# Patient Record
Sex: Female | Born: 2000 | Race: Black or African American | Hispanic: No | Marital: Single | Smoking: Never smoker
Health system: Southern US, Community
[De-identification: ages and names within clinical notes are randomized; demographics above are authoritative.]

## PROBLEM LIST (undated history)

## (undated) DIAGNOSIS — E669 Obesity, unspecified: Secondary | ICD-10-CM

## (undated) DIAGNOSIS — J189 Pneumonia, unspecified organism: Secondary | ICD-10-CM

## (undated) DIAGNOSIS — T7840XA Allergy, unspecified, initial encounter: Secondary | ICD-10-CM

## (undated) DIAGNOSIS — J0100 Acute maxillary sinusitis, unspecified: Secondary | ICD-10-CM

## (undated) HISTORY — PX: OTHER SURGICAL HISTORY: SHX169

## (undated) HISTORY — DX: Obesity, unspecified: E66.9

## (undated) HISTORY — DX: Allergy, unspecified, initial encounter: T78.40XA

## (undated) HISTORY — DX: Acute maxillary sinusitis, unspecified: J01.00

## (undated) HISTORY — DX: Pneumonia, unspecified organism: J18.9

---
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
## 2022-02-24 ENCOUNTER — Ambulatory Visit (INDEPENDENT_AMBULATORY_CARE_PROVIDER_SITE_OTHER): Payer: BC Managed Care – PPO | Admitting: Pediatrics

## 2022-02-24 VITALS — BP 102/80 | Wt 276.2 lb

## 2022-02-24 DIAGNOSIS — F32A Depression, unspecified: Secondary | ICD-10-CM

## 2022-02-24 DIAGNOSIS — Z9189 Other specified personal risk factors, not elsewhere classified: Secondary | ICD-10-CM | POA: Diagnosis not present

## 2022-02-24 DIAGNOSIS — R45851 Suicidal ideations: Secondary | ICD-10-CM | POA: Diagnosis not present

## 2022-02-24 DIAGNOSIS — F419 Anxiety disorder, unspecified: Secondary | ICD-10-CM

## 2022-02-24 LAB — POCT URINALYSIS DIPSTICK
Bilirubin, UA: NEGATIVE
Blood, UA: NEGATIVE
Glucose, UA: NEGATIVE
Ketones, UA: NEGATIVE
Leukocytes, UA: NEGATIVE
Nitrite, UA: NEGATIVE
Protein, UA: NEGATIVE
Spec Grav, UA: 1.02 (ref 1.010–1.025)
Urobilinogen, UA: NEGATIVE E.U./dL — AB
pH, UA: 6 (ref 5.0–8.0)

## 2022-02-24 LAB — POCT URINE PREGNANCY: Preg Test, Ur: NEGATIVE

## 2022-02-24 NOTE — Progress Notes (Unsigned)
Subjective:  Mariah Thomas is a 21 year old young adult her with the following complaints:  1)Feeling nausea 2) having cramps (period like cramps but not on period) 3) lightheaded, photophobia 4) Headaches-along the side of the head 5) internal and external stressors  -taking 9 classes this semester  -works, volunteers, and has an Associate Professor  -knows her boyfriend is cheating on her   -"I really have no business being with him. He's 25 and has 5 kids".    -doesn't see boyfriend often because he works 3 jobs "because he already has 5 kids"   -doesn't want to break up with boyfriend because she doesn't want to be alone 6) self-medicates  -smokes pot 3 to 4 times per day, "when I have the money"  -drinks alcohol- "more alcohol than water", drank 1/2 a bottle of rum last night 7) is sexually active with her boyfriend  -wants to get back on birthcontrol   -uses condoms- "I do not want kids, ever" 8) reports SI but "I don't have time to do anything about it"  -has seen therapist on campus- "he told me I was a hypocrite"   -when asked for clarification- "I take care of everyone else but don't take care of myself"  -reports that she's safe to herself at this time  -has the number for the mental health crisis hotline 9) needs to follow up with Adolescent Medicine  Review of Systems Pertinent items are noted in HPI.    Objective:    BP 102/80   Wt 276 lb 3.2 oz (125.3 kg)   BMI 42.00 kg/m  General appearance: alert, cooperative, appears stated age, and no distress Head: Normocephalic, without obvious abnormality, atraumatic Eyes: conjunctivae/corneas clear. PERRL, EOM's intact. Fundi benign. Ears: normal TM's and external ear canals both ears Nose: Nares normal. Septum midline. Mucosa normal. No drainage or sinus tenderness. Throat: lips, mucosa, and tongue normal; teeth and gums normal Neck: no adenopathy, no carotid bruit, no JVD, supple, symmetrical, trachea midline, and thyroid not  enlarged, symmetric, no tenderness/mass/nodules Lungs: clear to auscultation bilaterally Heart: regular rate and rhythm, S1, S2 normal, no murmur, click, rub or gallop    Results for orders placed or performed in visit on 02/24/22 (from the past 48 hour(s))  POCT urine pregnancy     Status: Normal   Collection Time: 02/24/22 11:17 AM  Result Value Ref Range   Preg Test, Ur Negative Negative  POCT urinalysis dipstick     Status: Abnormal   Collection Time: 02/24/22 11:17 AM  Result Value Ref Range   Color, UA Yellow    Clarity, UA Clear    Glucose, UA Negative Negative   Bilirubin, UA Negative    Ketones, UA Negative    Spec Grav, UA 1.020 1.010 - 1.025   Blood, UA Negative    pH, UA 6.0 5.0 - 8.0   Protein, UA Negative Negative   Urobilinogen, UA negative (A) 0.2 or 1.0 E.U./dL   Nitrite, UA negative    Leukocytes, UA Negative Negative   Appearance Clear    Odor None     Assessment:   Passive suicidal ideation Anxiety and depression At risk for STI   Plan:   Discuss risky habits and dangers of those habits, including negative loop (smoke and/or drink, feel better for a period of time, come off high/buzz and feel worse, etc) UPT negative UA negative Increase water intake, stop smoking marijuana and drinking alcohol Discussed "baby steps" of making small changes that are  easy to maintain with her schedule- decreasing frequency of smoking marijuana and drinking alcohol, keep small healthy snack in backpack, don't skip meals, etc.  Chart forwarded to Adolescent [facility], Mariah Thomas was seen by C. Maxwell Caul, FNP until provider left practice.  Urine for GC/CL pending, will notify Mariah Thomas of results once available.  30 minutes spent in direct face to face time with patient

## 2022-02-25 ENCOUNTER — Encounter: Payer: Self-pay | Admitting: Pediatrics

## 2022-02-25 DIAGNOSIS — R45851 Suicidal ideations: Secondary | ICD-10-CM | POA: Insufficient documentation

## 2022-02-25 DIAGNOSIS — Z9189 Other specified personal risk factors, not elsewhere classified: Secondary | ICD-10-CM | POA: Insufficient documentation

## 2022-02-25 HISTORY — DX: Other specified personal risk factors, not elsewhere classified: Z91.89

## 2022-02-25 HISTORY — DX: Suicidal ideations: R45.851

## 2022-02-25 LAB — C. TRACHOMATIS/N. GONORRHOEAE RNA
C. trachomatis RNA, TMA: NOT DETECTED
N. gonorrhoeae RNA, TMA: NOT DETECTED

## 2022-02-25 NOTE — Patient Instructions (Signed)
Call Adolescent Medicine to schedule appointment with Neysa Bonito to restart birthcontrol Follow up with either campus health therapist or schedule appointment with [facility], integrated behavioral health clinician, here Follow up as needed  At [facility] we value your feedback. You may receive a survey about your visit today. Please share your experience as we strive to create trusting relationships with our patients to provide genuine, compassionate, quality care.

## 2022-02-26 ENCOUNTER — Encounter: Payer: Self-pay | Admitting: Family

## 2022-02-26 ENCOUNTER — Ambulatory Visit (INDEPENDENT_AMBULATORY_CARE_PROVIDER_SITE_OTHER): Payer: BC Managed Care – PPO | Admitting: Family

## 2022-02-26 VITALS — BP 112/71 | HR 94 | Ht 68.0 in | Wt 279.6 lb

## 2022-02-26 DIAGNOSIS — Z113 Encounter for screening for infections with a predominantly sexual mode of transmission: Secondary | ICD-10-CM

## 2022-02-26 DIAGNOSIS — Z114 Encounter for screening for human immunodeficiency virus [HIV]: Secondary | ICD-10-CM | POA: Diagnosis not present

## 2022-02-26 DIAGNOSIS — N898 Other specified noninflammatory disorders of vagina: Secondary | ICD-10-CM | POA: Diagnosis not present

## 2022-02-26 LAB — POCT RAPID HIV: Rapid HIV, POC: NEGATIVE

## 2022-02-26 NOTE — Progress Notes (Signed)
History was provided by the patient.  Mariah Thomas is a 21 y.o. female who is here for vaginal discharge.   PCP confirmed? Yes.    [name], NP  Plan from last visit:    HPI:   -thinks she has yeast infection -new partner wants to be tested  -irritated, no smell; doesn't think it is red but just itching  -no pain or burning with urination  -no pain with intercourse   -at 3PM today she is scheduled to go to [employer] student counseling services; she is willing to go to this appointment today; is safe to self.    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 DISCARD PLACEBO PILLS AND TAKE HORMONE CONTAINING PILLS DAILY. 112 tablet 3   No current facility-administered medications on file prior to visit.    No Known Allergies  Physical Exam:    Vitals:   02/26/22 1402  BP: 112/71  Pulse: 94  Weight: 279 lb 9.6 oz (126.8 kg)  Height: 5\' 8"  (1.727 m)   Wt  Readings from Last 3 Encounters:  02/26/22 279 lb 9.6 oz (126.8 kg) (>99 %, Z= 2.70)*  02/24/22 276 lb 3.2 oz (125.3 kg) (>99 %, Z= 2.68)*  07/28/21 266 lb 9.6 oz (120.9 kg) (>99 %, Z= 2.58)*   * Growth percentiles are based on CDC (Girls, 2-20 Years) data.     Blood pressure %iles are not available for patients who are 18 years or older. No LMP recorded.  Physical Exam Constitutional:      General: She is not in acute distress.    Appearance: She is well-developed.  HENT:     Head: Normocephalic and atraumatic.  Eyes:     General: No scleral icterus.    Pupils: Pupils are equal, round, and reactive to light.  Neck:     Thyroid: No thyromegaly.  Cardiovascular:     Rate and Rhythm: Normal rate and regular rhythm.     Heart sounds: Normal heart sounds. No murmur heard. Pulmonary:     Effort: Pulmonary effort is normal.     Breath sounds: Normal breath sounds.  Musculoskeletal:        General: Normal range of motion.     Cervical back: Normal range of motion and neck supple.  Lymphadenopathy:     Cervical: No cervical adenopathy.  Skin:    General: Skin is warm and dry.     Findings: No rash.  Neurological:  Mental Status: She is alert and oriented to person, place, and time.     Cranial Nerves: No cranial nerve deficit.  Psychiatric:        Behavior: Behavior normal.        Thought Content: Thought content normal.        Judgment: Judgment normal.      Assessment/Plan:  1. Vaginal itching -self swab today; reviewed negative gc/c and negative pg test at recent PCP visit  -she had limited time today due to scheduled counseling services -My Chart for POCT HIV and wet prep results  -wil discuss birth control options at next visit; will schedule through My Chart  - WET PREP BY MOLECULAR PROBE  2. Routine screening for STI (sexually transmitted infection) - POCT Rapid HIV

## 2022-02-27 ENCOUNTER — Other Ambulatory Visit: Payer: Self-pay | Admitting: Family

## 2022-02-27 ENCOUNTER — Encounter: Payer: Self-pay | Admitting: Family

## 2022-02-27 LAB — WET PREP BY MOLECULAR PROBE
Candida species: NOT DETECTED
MICRO NUMBER:: 14285694
SPECIMEN QUALITY:: ADEQUATE
Trichomonas vaginosis: NOT DETECTED

## 2022-02-27 MED ORDER — METRONIDAZOLE 500 MG PO TABS: 500.0000 mg | ORAL_TABLET | Freq: Two times a day (BID) | ORAL | 0 refills | Status: AC

## 2022-05-21 ENCOUNTER — Encounter: Payer: Self-pay | Admitting: Family

## 2022-05-22 ENCOUNTER — Other Ambulatory Visit: Payer: Self-pay | Admitting: Family

## 2022-05-22 MED ORDER — METRONIDAZOLE 500 MG PO TABS: 500.0000 mg | ORAL_TABLET | Freq: Two times a day (BID) | ORAL | 0 refills | Status: DC

## 2022-06-10 ENCOUNTER — Ambulatory Visit (INDEPENDENT_AMBULATORY_CARE_PROVIDER_SITE_OTHER): Payer: BC Managed Care – PPO | Admitting: Pediatrics

## 2022-06-10 ENCOUNTER — Ambulatory Visit (HOSPITAL_COMMUNITY)
Admission: RE | Admit: 2022-06-10 | Discharge: 2022-06-10 | Disposition: A | Discharge status: Home / Self Care | Payer: BC Managed Care – PPO | Source: Ambulatory Visit | Attending: Pediatrics | Admitting: Pediatrics

## 2022-06-10 VITALS — Wt 281.0 lb

## 2022-06-10 DIAGNOSIS — R55 Syncope and collapse: Secondary | ICD-10-CM | POA: Insufficient documentation

## 2022-06-10 NOTE — Progress Notes (Signed)
Subjective:   History provided by Mariah Thomas and mother  Mariah Thomas is a 22 y.o. female who presents for evaluation of syncopal episodes. She passed out at work, injuring her right wrist and ankle. She did not hit her head. This is the 4th episode in 6 months. She will see black dots and become really hot before she passes out. Mom is very concerned because Mariah Thomas's older brother had a lot of symptoms and was eventually diagnosed with Non-Hodgkin's lymphoma. Mom is requesting a head scan. Mariah Thomas admits that she doesn't eat much during the day.   The following portions of the patient's history were reviewed and updated as appropriate: allergies, current medications, past family history, past medical history, past social history, past surgical history, and problem list.  Review of Systems Pertinent items are noted in HPI.   Objective:    Wt 281 lb (127.5 kg)   BMI 42.73 kg/m  General appearance: alert, cooperative, appears stated age, and no distress Head: Normocephalic, without obvious abnormality, atraumatic Eyes: conjunctivae/corneas clear. PERRL, EOM's intact. Fundi benign. Ears: normal TM's and external ear canals both ears Nose: Nares normal. Septum midline. Mucosa normal. No drainage or sinus tenderness. Throat: lips, mucosa, and tongue normal; teeth and gums normal Neck: no adenopathy, no carotid bruit, no JVD, supple, symmetrical, trachea midline, and thyroid not enlarged, symmetric, no tenderness/mass/nodules Lungs: clear to auscultation bilaterally Heart: regular rate and rhythm, S1, S2 normal, no murmur, click, rub or gallop and normal apical impulse   Assessment:    Syncope   Plan:   Referred to cardiology for evaluation Blood work per orders, will call with results EKG ordered Discussed importance of eating and drinking plenty of water Follow up as needed

## 2022-06-10 NOTE — Patient Instructions (Signed)
Referred to Cardiology for multiple episodes of passing out Make sure you are eating, even if it's something small, throughout the day and stay hydrated Keep a log of when you are having episodes- what was happening before you passed out, how did you feel, how long were you unconscious, how long did it take you to recover

## 2022-06-11 LAB — CBC WITH DIFFERENTIAL/PLATELET
Absolute Monocytes: 361 cells/uL (ref 200–950)
Basophils Absolute: 33 cells/uL (ref 0–200)
Basophils Relative: 0.4 %
Eosinophils Absolute: 123 cells/uL (ref 15–500)
Eosinophils Relative: 1.5 %
HCT: 39.3 % (ref 35.0–45.0)
Hemoglobin: 12.6 g/dL (ref 11.7–15.5)
Lymphs Abs: 2222 cells/uL (ref 850–3900)
MCH: 26.3 pg — ABNORMAL LOW (ref 27.0–33.0)
MCHC: 32.1 g/dL (ref 32.0–36.0)
MCV: 81.9 fL (ref 80.0–100.0)
MPV: 11.5 fL (ref 7.5–12.5)
Monocytes Relative: 4.4 %
Neutro Abs: 5461 cells/uL (ref 1500–7800)
Neutrophils Relative %: 66.6 %
Platelets: 325 10*3/uL (ref 140–400)
RBC: 4.8 10*6/uL (ref 3.80–5.10)
RDW: 12.4 % (ref 11.0–15.0)
Total Lymphocyte: 27.1 %
WBC: 8.2 10*3/uL (ref 3.8–10.8)

## 2022-06-11 LAB — COMPREHENSIVE METABOLIC PANEL
AG Ratio: 1.3 (calc) (ref 1.0–2.5)
ALT: 17 U/L (ref 5–32)
AST: 17 U/L (ref 12–32)
Albumin: 4.1 g/dL (ref 3.6–5.1)
Alkaline phosphatase (APISO): 46 U/L (ref 36–128)
BUN: 10 mg/dL (ref 7–20)
CO2: 24 mmol/L (ref 20–32)
Calcium: 9.6 mg/dL (ref 8.9–10.4)
Chloride: 105 mmol/L (ref 98–110)
Creat: 0.67 mg/dL (ref 0.50–0.96)
Globulin: 3.1 g/dL (calc) (ref 2.0–3.8)
Glucose, Bld: 86 mg/dL (ref 65–99)
Potassium: 4.1 mmol/L (ref 3.8–5.1)
Sodium: 138 mmol/L (ref 135–146)
Total Bilirubin: 0.4 mg/dL (ref 0.2–1.1)
Total Protein: 7.2 g/dL (ref 6.3–8.2)

## 2022-06-11 LAB — VITAMIN D 25 HYDROXY (VIT D DEFICIENCY, FRACTURES): Vit D, 25-Hydroxy: 10 ng/mL — ABNORMAL LOW (ref 30–100)

## 2022-06-11 LAB — T4, FREE: Free T4: 0.9 ng/dL (ref 0.8–1.4)

## 2022-06-11 LAB — TSH: TSH: 0.68 mIU/L

## 2022-06-12 ENCOUNTER — Encounter: Payer: Self-pay | Admitting: Pediatrics

## 2022-06-12 ENCOUNTER — Telehealth: Payer: Self-pay | Admitting: Pediatrics

## 2022-06-12 DIAGNOSIS — R55 Syncope and collapse: Secondary | ICD-10-CM | POA: Insufficient documentation

## 2022-06-12 MED ORDER — VITAMIN D (ERGOCALCIFEROL) 1.25 MG (50000 UNIT) PO CAPS: 50000.0000 [IU] | ORAL_CAPSULE | ORAL | 0 refills | Status: DC

## 2022-06-12 NOTE — Telephone Encounter (Signed)
Reviewed lab results with [facility]. Labs WNL except for vitamin D. Weekly vitamin D supplement sent to pharmacy. Carrine verbalized understanding.

## 2022-06-24 ENCOUNTER — Encounter: Payer: Self-pay | Admitting: Family

## 2022-07-06 NOTE — Progress Notes (Deleted)
Cardiology Office Note:    Date:  07/06/2022   ID:  Mariah Thomas, DOB 2000/08/24, MRN [id number]  PCP:  [name], NP   [facility] Providers Cardiologist:  None { Click to update primary MD,subspecialty MD or APP then REFRESH:1}    Referring MD: [name], NP   No chief complaint on file. Syncope  History of Present Illness:    Mariah Thomas is a 22 y.o. 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   Social History   Socioeconomic History   Marital status: Single    Spouse name: Not on file   Number of children: Not on file   Years of education: Not on file   Highest education level: Not on file  Occupational History   Not on file  Tobacco Use   Smoking status: Never    Passive exposure: Never   Smokeless tobacco: Never  Vaping Use   Vaping Use: Never used  Substance and Sexual Activity   Alcohol use: Yes   Drug use: Yes    Types: Marijuana   Sexual activity: Yes  Other Topics Concern   Not on file  Social History Narrative      Father has recently returned home, incarcerated for 2 years.    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[employer], kinesiology    Lives in apartment    Social Determinants of Health   Financial Resource Strain: Low Risk  (02/08/2019)   Overall Financial Resource Strain (CARDIA)    Difficulty of Paying Living Expenses: Not hard at all  Food Insecurity: Unknown (02/08/2019)   Hunger Vital Sign    Worried About Programme researcher, broadcasting/film/video in the Last Year: Patient declined    Barista in the Last Year: Patient declined  Transportation Needs: Unknown (02/08/2019)   PRAPARE - Administrator, Civil Service (Medical): Patient declined    Lack of Transportation (Non-Medical): Patient declined  Physical Activity: Not on file  Stress: Not on file  Social Connections: Not on file     Family History: The patient's family history includes Asthma in her brother; Cancer in her paternal grandmother; Diabetes in her maternal grandfather and paternal grandmother; Hypertension in her maternal grandfather and maternal grandmother; Mental illness in her paternal grandmother. There is no history of Alcohol abuse, Arthritis, Birth defects, COPD, Depression, Drug abuse, Early death, Hearing loss, Heart disease, Hyperlipidemia, Kidney disease, Learning disabilities, Mental retardation, Miscarriages / Stillbirths, Stroke, Vision loss, or Varicose Veins.  ROS:   Please see the history of present illness.    *** All  other systems reviewed and are negative.  EKGs/Labs/Other Studies Reviewed:    The following studies were reviewed today: ***  EKG:  EKG is *** ordered today.  The ekg ordered today demonstrates ***  Recent Labs: 06/10/2022: ALT 17; BUN 10; Creat 0.67; Hemoglobin 12.6; Platelets 325; Potassium 4.1; Sodium 138; TSH 0.68  Recent Lipid Panel No results found for: "CHOL", "TRIG", "HDL", "CHOLHDL", "VLDL", "LDLCALC", "LDLDIRECT"   Risk Assessment/Calculations:     Physical Exam:    VS:  There were no vitals taken for this visit.    Wt Readings from Last 3 Encounters:  06/10/22 281 lb (127.5 kg) (>99 %, Z= 2.73)*  02/26/22 279  lb 9.6 oz (126.8 kg) (>99 %, Z= 2.70)*  02/24/22 276 lb 3.2 oz (125.3 kg) (>99 %, Z= 2.68)*   * Growth percentiles are based on CDC (Girls, 2-20 Years) data.     GEN: *** Well nourished, well developed in no acute distress HEENT: Normal NECK: No JVD; No carotid bruits LYMPHATICS: No lymphadenopathy CARDIAC: ***RRR, no murmurs, rubs, gallops RESPIRATORY:  Clear to auscultation without rales, wheezing or rhonchi  ABDOMEN: Soft, non-tender, non-distended MUSCULOSKELETAL:  No edema; No deformity  SKIN: Warm and dry NEUROLOGIC:  Alert and oriented x 3 PSYCHIATRIC:  Normal affect   ASSESSMENT:     PLAN:    In order of problems listed above:  ***      {Are you ordering a CV Procedure (e.g. stress test, cath, DCCV, TEE, etc)?   Press F2        :[id number]}    Medication Adjustments/Labs and Tests Ordered: Current medicines are reviewed at length with the patient today.  Concerns regarding medicines are outlined above.  No orders of the defined types were placed in this encounter.  No orders of the defined types were placed in this encounter.   There are no Patient Instructions on file for this visit.   Signed, Maisie Fus, MD  07/06/2022 3:19 PM    [facility]

## 2022-07-07 ENCOUNTER — Ambulatory Visit: Payer: BC Managed Care – PPO | Admitting: Internal Medicine

## 2022-07-13 ENCOUNTER — Ambulatory Visit (INDEPENDENT_AMBULATORY_CARE_PROVIDER_SITE_OTHER): Payer: BC Managed Care – PPO | Admitting: Family

## 2022-07-13 ENCOUNTER — Encounter: Payer: Self-pay | Admitting: Pediatrics

## 2022-07-13 ENCOUNTER — Encounter: Payer: Self-pay | Admitting: Family

## 2022-07-13 VITALS — BP 105/71 | HR 72 | Ht 68.0 in | Wt 270.6 lb

## 2022-07-13 DIAGNOSIS — Z113 Encounter for screening for infections with a predominantly sexual mode of transmission: Secondary | ICD-10-CM | POA: Diagnosis not present

## 2022-07-13 DIAGNOSIS — Z3202 Encounter for pregnancy test, result negative: Secondary | ICD-10-CM | POA: Diagnosis not present

## 2022-07-13 DIAGNOSIS — Z130 Encounter for screening for diseases of the blood and blood-forming organs and certain disorders involving the immune mechanism: Secondary | ICD-10-CM | POA: Diagnosis not present

## 2022-07-13 DIAGNOSIS — N946 Dysmenorrhea, unspecified: Secondary | ICD-10-CM

## 2022-07-13 LAB — POCT URINE PREGNANCY: Preg Test, Ur: NEGATIVE

## 2022-07-13 LAB — POCT HEMOGLOBIN: Hemoglobin: 11 g/dL (ref 11–14.6)

## 2022-07-13 NOTE — Progress Notes (Unsigned)
History was provided by the {[email or web address]}.  Mariah Thomas is a 22 y.o. female who is here for ***.   PCP confirmed? {yes [id number]}  [name], NP  HPI:   -bleeding from rectum tear healed; no issues now  -LMP Thursday, normal bleeding; 2nd day cramping  -not using any birth control, no pain with intercourse  -no pain or burning with urination  -no vaginal discharge change  -had GI bug on Tuesday through Thursday     Patient Active Problem List   Diagnosis Date Noted   Syncope 06/12/2022    Current Outpatient Medications on File Prior to Visit  Medication Sig Dispense Refill   metroNIDAZOLE (FLAGYL) 500 MG tablet Take 1 tablet (500 mg total) by mouth 2 (two) times daily. (Patient not taking: Reported on 07/13/2022) 14 tablet 0   fluconazole (DIFLUCAN) 150 MG tablet Take 1 tablet today, 1 tablet 3 days from now and 1 tablet 7 days from now (Patient not taking: Reported on 07/13/2022) 3 tablet 0   norgestimate-ethinyl estradiol (SPRINTEC 28) 0.25-35 MG-MCG tablet TAKE 1 TABLET DAILY MY MOUTH. DISCARD PLACEBO PILLS AND TAKE HORMONE CONTAINING PILLS DAILY. (Patient not taking: Reported on 07/13/2022) 112 tablet 3   Vitamin D, Ergocalciferol, (DRISDOL) 1.25 MG (50000 UNIT) CAPS capsule Take 1 capsule (50,000 Units total) by mouth every 7 (seven) days for 8 doses. (Patient not taking: Reported on 07/13/2022) 8 capsule 0   No current facility-administered medications on file prior to visit.    No Known Allergies  Physical Exam:    Vitals:   07/13/22 1130  BP: 105/71  Pulse: 72  Weight: 270 lb 9.6 oz (122.7 kg)  Height:  (1.727 m)    Blood pressure %iles are not available for patients who are 18 years or older. No LMP recorded.  Physical Exam   Assessment/Plan: ***

## 2022-07-14 ENCOUNTER — Encounter: Payer: Self-pay | Admitting: Pediatrics

## 2022-07-14 ENCOUNTER — Ambulatory Visit (INDEPENDENT_AMBULATORY_CARE_PROVIDER_SITE_OTHER): Payer: BC Managed Care – PPO | Admitting: Pediatrics

## 2022-07-14 VITALS — BP 118/70 | Ht 68.4 in | Wt 271.0 lb

## 2022-07-14 DIAGNOSIS — R45851 Suicidal ideations: Secondary | ICD-10-CM | POA: Diagnosis not present

## 2022-07-14 DIAGNOSIS — Z00121 Encounter for routine child health examination with abnormal findings: Secondary | ICD-10-CM

## 2022-07-14 DIAGNOSIS — Z0001 Encounter for general adult medical examination with abnormal findings: Secondary | ICD-10-CM | POA: Diagnosis not present

## 2022-07-14 LAB — C. TRACHOMATIS/N. GONORRHOEAE RNA
C. trachomatis RNA, TMA: NOT DETECTED
N. gonorrhoeae RNA, TMA: NOT DETECTED

## 2022-07-14 LAB — WET PREP BY MOLECULAR PROBE
Candida species: NOT DETECTED
MICRO NUMBER:: 14857205
SPECIMEN QUALITY:: ADEQUATE
Trichomonas vaginosis: NOT DETECTED

## 2022-07-14 NOTE — Progress Notes (Signed)
Subjective:     Mariah Thomas is a 22 y.o. female and is here for a comprehensive physical exam. The patient reports -got sick and appetite hasn't come back  -tries to eat but becomes nauseated  -site/smell of food make nauseated  -urine pregnancy test done yesterday was negative -reports daily SI  -attempted suicide last week  -reports "I'm straight" (I'm fine)  -denies current plan  Social History   Socioeconomic History   Marital status: Single    Spouse name: Not on file   Number of children: Not on file   Years of education: Not on file   Highest education level: 12th grade  Occupational History   Not on file  Tobacco Use   Smoking status: Never    Passive exposure: Never   Smokeless tobacco: Never  Vaping Use   Vaping Use: Never used  Substance and Sexual Activity   Alcohol use: Yes    Comment: a few times a week, has improved   Drug use: Yes    Types: Marijuana    Comment: smokes marijuana once a day   Sexual activity: Yes    Birth control/protection: Condom  Other Topics Concern   Not on file  Social History Narrative      Father has recently returned home, incarcerated for 2 years.    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[employer], kinesiology , expects to graduate in December 2024   Lives in apartment   Social Determinants of Health   Financial Resource Strain: Low Risk  (02/08/2019)   Overall Financial Resource Strain (CARDIA)    Difficulty of Paying Living Expenses: Not hard at all  Food Insecurity: Unknown (02/08/2019)   Hunger Vital Sign    Worried About Programme researcher, broadcasting/film/video in the Last Year: Patient declined    Barista in the Last Year: Patient declined  Transportation Needs: Unknown (02/08/2019)   PRAPARE - Administrator, Civil Service (Medical): Patient declined    Lack of Transportation (Non-Medical): Patient declined  Physical Activity: Not on file  Stress: Stress Concern  Present (07/14/2022)   [name] of Occupational Health - Occupational Stress Questionnaire    Feeling of Stress : Very much  Social Connections: Not on file  Intimate Partner Violence: Not on file   Health Maintenance  Topic Date Due   COVID-19 Vaccine (1) Never done   Hepatitis C Screening  Never done   INFLUENZA VACCINE  10/22/2022   CHLAMYDIA SCREENING  07/13/2023   DTaP/Tdap/Td (7 - Td or Tdap) 12/21/2023   HPV VACCINES  Completed   HIV Screening  Completed    The following portions of the patient's history were reviewed and updated as appropriate: allergies, current medications, past family history, past medical history, past social history, past surgical history, and problem list.  Review of Systems Pertinent items are noted in HPI.   Objective:    BP 118/70   Ht 5' 8.4" (1.737 m)   Wt 271 lb (122.9 kg)   BMI 40.72 kg/m  General appearance: alert, cooperative, appears stated age, and no distress Head: Normocephalic, without obvious abnormality, atraumatic Eyes: conjunctivae/corneas clear. PERRL, EOM's intact. Fundi benign. Ears: normal TM's and external ear canals both ears Nose: Nares normal. Septum midline. Mucosa normal. No drainage or sinus tenderness. Throat: lips, mucosa, and tongue normal; teeth and gums normal Neck: no adenopathy, no carotid  bruit, no JVD, supple, symmetrical, trachea midline, and thyroid not enlarged, symmetric, no tenderness/mass/nodules Lungs: clear to auscultation bilaterally Heart: regular rate and rhythm, S1, S2 normal, no murmur, click, rub or gallop and normal apical impulse Abdomen: soft, non-tender; bowel sounds normal; no masses,  no organomegaly Extremities: extremities normal, atraumatic, no cyanosis or edema Pulses: 2+ and symmetric Skin: Skin color, texture, turgor normal. No rashes or lesions Neurologic: Grossly normal    Assessment:    Healthy female exam.  Obese- BMI 40.72kg/m2 Suicidal ideation     Plan:      See After Visit Summary for Counseling Recommendations  Discussed talk therapy options, Rilla refuses  Reminded her to use the Mental Health Crisis Hotline number 988 Encouraged her to consider therapy, stop self-medicating with marijuana and ETOH

## 2022-07-14 NOTE — Patient Instructions (Addendum)
At [facility] we value your feedback. You may receive a survey about your visit today. Please share your experience as we strive to create trusting relationships with our patients to provide genuine, compassionate, quality care.  Bring your diploma to the office to show me when you graduate!  Preventive Care 20-20 Years Old, Female Preventive care refers to lifestyle choices and visits with your health care provider that can promote health and wellness. At this stage in your life, you may start seeing a primary care physician instead of a pediatrician for your preventive care. Preventive care visits are also called wellness exams. What can I expect for my preventive care visit? Counseling During your preventive care visit, your health care provider may ask about your: Medical history, including: Past medical problems. Family medical history. Pregnancy history. Current health, including: Menstrual cycle. Method of birth control. Emotional well-being. Home life and relationship well-being. Sexual activity and sexual health. Lifestyle, including: Alcohol, nicotine or tobacco, and drug use. Access to firearms. Diet, exercise, and sleep habits. Sunscreen use. Motor vehicle safety. Physical exam Your health care provider may check your: Height and weight. These may be used to calculate your BMI (body mass index). BMI is a measurement that tells if you are at a healthy weight. Waist circumference. This measures the distance around your waistline. This measurement also tells if you are at a healthy weight and may help predict your risk of certain diseases, such as type 2 diabetes and high blood pressure. Heart rate and blood pressure. Body temperature. Skin for abnormal spots. Breasts. What immunizations do I need? Vaccines are usually given at various ages, according to a schedule. Your health care provider will recommend vaccines for you based on your age, medical history, and  lifestyle or other factors, such as travel or where you work. What tests do I need? Screening Your health care provider may recommend screening tests for certain conditions. This may include: Vision and hearing tests. Lipid and cholesterol levels. Pelvic exam and Pap test. Hepatitis B test. Hepatitis C test. HIV (human immunodeficiency virus) test. STI (sexually transmitted infection) testing, if you are at risk. Tuberculosis skin test if you have symptoms. BRCA-related cancer screening. This may be done if you have a family history of breast, ovarian, tubal, or peritoneal cancers. Talk with your health care provider about your test results, treatment options, and if necessary, the need for more tests. Follow these instructions at home: Eating and drinking Eat a healthy diet that includes fresh fruits and vegetables, whole grains, lean protein, and low-fat dairy products. Drink enough fluid to keep your urine pale yellow. Do not drink alcohol if: Your health care provider tells you not to drink. You are pregnant, may be pregnant, or are planning to become pregnant. You are under the legal drinking age. In the [place] the legal drinking age is 5. If you drink alcohol: Limit how much you have to 0-1 drink a day. Know how much alcohol is in your drink. In the [place] one drink equals one 12 oz bottle of beer (355 mL), one 5 oz glass of wine (148 mL), or one 1 oz glass of hard liquor (44 mL). Lifestyle Brush your teeth every morning and night with fluoride toothpaste. Floss one time each day. Exercise for at least 30 minutes 5 or more days of the week. Do not use any products that contain nicotine or tobacco. These products include cigarettes, chewing tobacco, and vaping devices, such as e-cigarettes. If you need help quitting,  ask your health care provider. Do not use drugs. If you are sexually active, practice safe sex. Use a condom or other form of protection to prevent STIs. If you do  not wish to become pregnant, use a form of birth control. If you plan to become pregnant, see your health care provider for a prepregnancy visit. Find healthy ways to manage stress, such as: Meditation, yoga, or listening to music. Journaling. Talking to a trusted person. Spending time with friends and family. Safety Always wear your seat belt while driving or riding in a vehicle. Do not drive: If you have been drinking alcohol. Do not ride with someone who has been drinking. When you are tired or distracted. While texting. If you have been using any mind-altering substances or drugs. Wear a helmet and other protective equipment during sports activities. If you have firearms in your house, make sure you follow all gun safety procedures. Seek help if you have been bullied, physically abused, or sexually abused. Use the internet responsibly to avoid dangers, such as online bullying and online sex predators. What's next? Go to your health care provider once a year for an annual wellness visit. Ask your health care provider how often you should have your eyes and teeth checked. Stay up to date on all vaccines. This information is not intended to replace advice given to you by your health care provider. Make sure you discuss any questions you have with your health care provider. Document Revised: 09/04/2020 Document Reviewed: 09/04/2020 Elsevier Patient Education  2023 [employer].

## 2022-07-16 ENCOUNTER — Encounter: Payer: Self-pay | Admitting: Family

## 2022-07-16 ENCOUNTER — Other Ambulatory Visit: Payer: Self-pay | Admitting: Family

## 2022-07-16 MED ORDER — METRONIDAZOLE 500 MG PO TABS: 500.0000 mg | ORAL_TABLET | Freq: Two times a day (BID) | ORAL | 0 refills | Status: DC

## 2022-07-16 MED ORDER — IRON 325 (65 FE) MG PO TABS: ORAL_TABLET | ORAL | 0 refills | Status: AC

## 2022-08-08 ENCOUNTER — Encounter: Payer: Self-pay | Admitting: Family

## 2022-08-10 ENCOUNTER — Other Ambulatory Visit (HOSPITAL_COMMUNITY)
Admission: RE | Admit: 2022-08-10 | Discharge: 2022-08-10 | Disposition: A | Discharge status: Home / Self Care | Payer: BC Managed Care – PPO | Source: Ambulatory Visit | Attending: Family | Admitting: Family

## 2022-08-10 ENCOUNTER — Encounter: Payer: Self-pay | Admitting: Family

## 2022-08-10 ENCOUNTER — Encounter: Payer: Self-pay | Admitting: Pediatrics

## 2022-08-10 ENCOUNTER — Ambulatory Visit (INDEPENDENT_AMBULATORY_CARE_PROVIDER_SITE_OTHER): Payer: BC Managed Care – PPO | Admitting: Family

## 2022-08-10 VITALS — BP 88/59 | HR 58 | Ht 68.11 in | Wt 278.4 lb

## 2022-08-10 DIAGNOSIS — N898 Other specified noninflammatory disorders of vagina: Secondary | ICD-10-CM | POA: Insufficient documentation

## 2022-08-10 NOTE — Addendum Note (Signed)
Addended by: Ardeth Sportsman on: 08/10/2022 04:04 PM   Modules accepted: Orders
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
## 2022-08-12 ENCOUNTER — Encounter: Payer: Self-pay | Admitting: Family

## 2022-08-12 ENCOUNTER — Other Ambulatory Visit: Payer: Self-pay | Admitting: Family

## 2022-08-12 LAB — HSV DNA BY PCR (REFERENCE LAB)
HSV 1 DNA: NEGATIVE
HSV 2 DNA: POSITIVE — AB

## 2022-08-12 MED ORDER — VALACYCLOVIR HCL 500 MG PO TABS: 500.0000 mg | ORAL_TABLET | Freq: Two times a day (BID) | ORAL | 0 refills | Status: AC

## 2022-08-13 ENCOUNTER — Telehealth (INDEPENDENT_AMBULATORY_CARE_PROVIDER_SITE_OTHER): Payer: BC Managed Care – PPO | Admitting: Family

## 2022-08-13 ENCOUNTER — Encounter: Payer: Self-pay | Admitting: Family

## 2022-08-13 DIAGNOSIS — Z8619 Personal history of other infectious and parasitic diseases: Secondary | ICD-10-CM

## 2022-08-13 DIAGNOSIS — N898 Other specified noninflammatory disorders of vagina: Secondary | ICD-10-CM | POA: Diagnosis not present

## 2022-08-13 DIAGNOSIS — Z113 Encounter for screening for infections with a predominantly sexual mode of transmission: Secondary | ICD-10-CM

## 2022-08-13 NOTE — Progress Notes (Signed)
THIS RECORD MAY CONTAIN CONFIDENTIAL INFORMATION THAT SHOULD NOT BE RELEASED WITHOUT REVIEW OF THE SERVICE PROVIDER.  Virtual Follow-Up Visit via Video Note  I connected with Mariah Thomas  on 08/13/22 at  8:30 AM EDT by a video enabled telemedicine application and verified that I am speaking with the correct person using two identifiers.   Patient/parent location: car Provider location: remote [place]   I discussed the limitations of evaluation and management by telemedicine and the availability of in person appointments.  I discussed that the purpose of this telehealth visit is to provide medical care while limiting exposure to the novel coronavirus.  The patient expressed understanding and agreed to proceed.   Mariah Thomas is a 22 y.o. female referred by [name], NP here today for follow-up results of HSV 2 DNA + vaginal lesion DNA by PCR.   History was provided by the patient.  Supervising Physician: Dr. Theadore Nan   Plan from Last Visit:   1. Vaginal lesion -swabbed lesion; reviewed HSV symptoms and treatment; DDx include cut/small laceration - advised barrier ointment until culture results returned; abstain from intercourse until symptoms resolve  -return as needed  - HSV DNA by PCR (reference lab)  Chief Complaint: Vaginal lesion   History of Present Illness:  -has not picked up medication and lesion is almost gone, improved  -questions if this could be false positive  -reviewed HSV antibodies negative results from 06/22/2019  -currently in relationship  -would like HSV antibody testing, as well as HIV and RPR  -no other symptoms at present  No Known Allergies Outpatient Medications Prior to Visit  Medication Sig Dispense Refill   valACYclovir (VALTREX) 500 MG tablet Take 1 tablet (500 mg total) by mouth 2 (two) times daily for 3 days. 6 tablet 0   Ferrous Sulfate (IRON) 325 (65 Fe) MG TABS Take one tablet daily with breakfast. (Patient not taking: Reported on  08/10/2022) 90 tablet 0   metroNIDAZOLE (FLAGYL) 500 MG tablet Take 1 tablet (500 mg total) by mouth 2 (two) times daily. (Patient not taking: Reported on 08/10/2022) 14 tablet 0   No facility-administered medications prior to visit.     Patient Active Problem List   Diagnosis Date Noted   Syncope 06/12/2022   Suicidal ideation 02/25/2022   Class 3 severe obesity due to excess calories without serious comorbidity with body mass index (BMI) of 40.0 to 44.9 in adult [facility]) 10/13/2011   Encounter for well child exam with abnormal findings 10/13/2011     The following portions of the patient's history were reviewed and updated as appropriate: allergies, current medications, past family history, past medical history, past social history, past surgical history, and problem list.  Visual Observations/Objective:   General Appearance: Well nourished well developed, in no apparent distress.  Eyes: conjunctiva no swelling or erythema ENT/Mouth: No hoarseness, No cough for duration of visit.  Neck: Supple  Respiratory: Respiratory effort normal, normal rate, no retractions or distress.   Cardio: Appears well-perfused, noncyanotic Musculoskeletal: no obvious deformity Skin: visible skin without rashes, ecchymosis, erythema Neuro: Awake and oriented X 3,  Psych:  tearful affect, Insight and Judgment appropriate.    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 Routine screening for STI (sexually transmitted infection)  -discussed suppressive and episodic treatment  -offered to speak with her and partner together  -discussed condom use, safe sex practices, viral shedding, and lesion avoidance   - Herpes Simplex Virus 1  and 2 (IgG), with Reflex to HSV-2 Inhibition - RPR - HIV Antibody (routine testing w rflx)   I discussed the assessment and treatment plan with the patient and/or parent/guardian.  They were provided an opportunity to ask  questions and all were answered.  They agreed with the plan and demonstrated an understanding of the instructions. They were advised to call back or seek an in-person evaluation in the emergency room if the symptoms worsen or if the condition fails to improve as anticipated.   Follow-up:   return to clinic at first available lab visit for blood work as above   Georges Mouse, NP    CC: Janene Harvey, Pascal Lux, NP, Janene Harvey, Pascal Lux, NP

## 2022-08-14 ENCOUNTER — Other Ambulatory Visit: Payer: BC Managed Care – PPO

## 2022-08-15 LAB — HERPES SIMPLEX VIRUS 1 AND 2 (IGG),REFLEX HSV-2 INHIBITION: HAV 1 IGG,TYPE SPECIFIC AB: <0.9 index

## 2022-08-15 LAB — RPR: RPR Ser Ql: NONREACTIVE

## 2022-08-16 ENCOUNTER — Other Ambulatory Visit: Payer: Self-pay | Admitting: Family

## 2022-08-16 ENCOUNTER — Encounter: Payer: Self-pay | Admitting: Family

## 2022-08-16 MED ORDER — VALACYCLOVIR HCL 500 MG PO TABS: 500.0000 mg | ORAL_TABLET | Freq: Every day | ORAL | 3 refills | Status: DC

## 2022-08-17 ENCOUNTER — Other Ambulatory Visit: Payer: Self-pay | Admitting: Family

## 2022-08-18 MED ORDER — METRONIDAZOLE 500 MG PO TABS: 500.0000 mg | ORAL_TABLET | Freq: Two times a day (BID) | ORAL | 0 refills | Status: DC

## 2022-08-26 LAB — HERPES SIMPLEX VIRUS 1 AND 2 (IGG),REFLEX HSV-2 INHIBITION: HSV 2 IGG,TYPE SPECIFIC AB: 9.61 index — ABNORMAL HIGH

## 2022-08-26 LAB — HSV 2 INHIBITION: HSV 2 IGG INHIBITION,IA: POSITIVE — AB

## 2022-08-26 LAB — HIV ANTIBODY (ROUTINE TESTING W REFLEX): HIV 1&2 Ab, 4th Generation: NONREACTIVE

## 2022-09-26 ENCOUNTER — Other Ambulatory Visit: Payer: Self-pay | Admitting: Family

## 2022-09-28 ENCOUNTER — Encounter: Payer: Self-pay | Admitting: Family

## 2022-09-28 ENCOUNTER — Other Ambulatory Visit: Payer: Self-pay | Admitting: Family

## 2022-09-28 MED ORDER — FLUCONAZOLE 150 MG PO TABS: 150.0000 mg | ORAL_TABLET | Freq: Every day | ORAL | 0 refills | Status: DC

## 2022-12-01 ENCOUNTER — Encounter: Payer: Self-pay | Admitting: Pediatrics

## 2023-01-13 ENCOUNTER — Encounter: Payer: Self-pay | Admitting: Family

## 2023-01-14 ENCOUNTER — Ambulatory Visit (INDEPENDENT_AMBULATORY_CARE_PROVIDER_SITE_OTHER): Payer: BC Managed Care – PPO | Admitting: Family

## 2023-01-14 ENCOUNTER — Encounter: Payer: Self-pay | Admitting: Pediatrics

## 2023-01-14 ENCOUNTER — Encounter: Payer: Self-pay | Admitting: Family

## 2023-01-14 ENCOUNTER — Other Ambulatory Visit (HOSPITAL_COMMUNITY)
Admission: RE | Admit: 2023-01-14 | Discharge: 2023-01-14 | Disposition: A | Discharge status: Home / Self Care | Payer: BC Managed Care – PPO | Source: Ambulatory Visit | Attending: Family | Admitting: Family

## 2023-01-14 VITALS — BP 99/56 | HR 77 | Ht 68.25 in | Wt 266.4 lb

## 2023-01-14 DIAGNOSIS — N898 Other specified noninflammatory disorders of vagina: Secondary | ICD-10-CM

## 2023-01-14 DIAGNOSIS — Z3202 Encounter for pregnancy test, result negative: Secondary | ICD-10-CM

## 2023-01-14 DIAGNOSIS — B3731 Acute candidiasis of vulva and vagina: Secondary | ICD-10-CM | POA: Insufficient documentation

## 2023-01-14 DIAGNOSIS — Z113 Encounter for screening for infections with a predominantly sexual mode of transmission: Secondary | ICD-10-CM | POA: Insufficient documentation

## 2023-01-14 DIAGNOSIS — Z7251 High risk heterosexual behavior: Secondary | ICD-10-CM

## 2023-01-14 LAB — POCT URINE PREGNANCY: Preg Test, Ur: NEGATIVE

## 2023-01-14 MED ORDER — FLUCONAZOLE 150 MG PO TABS
150.0000 mg | ORAL_TABLET | Freq: Every day | ORAL | 0 refills | Status: DC
Start: 2023-01-14 — End: 2023-08-03

## 2023-01-14 MED ORDER — METRONIDAZOLE 500 MG PO TABS
500.0000 mg | ORAL_TABLET | Freq: Two times a day (BID) | ORAL | 0 refills | Status: AC
Start: 2023-01-14 — End: ?

## 2023-01-14 NOTE — Progress Notes (Signed)
History was provided by the patient.  Mariah Thomas is a 22 y.o. female who is here for STI screenings.   PCP confirmed? Yes.    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 Routine screening for STI (sexually transmitted infection)   -discussed suppressive and episodic treatment  -offered to speak with her and partner together  -discussed condom use, safe sex practices, viral shedding, and lesion avoidance    - Herpes Simplex Virus 1 and 2 (IgG), with Reflex to HSV-2 Inhibition - RPR - HIV Antibody (routine testing w rflx)    Pertinent Labs:  HSV 2 positive 08/14/22 HIV non-reactive 08/14/22 RPR non-reactive 08/14/22 GC/C negative 07/13/22   HPI:   -vaginal itching, disharge  -no pain or burning with urination  -   Patient Active Problem List   Diagnosis Date Noted   Syncope 06/12/2022   Suicidal ideation 02/25/2022   Class 3 severe obesity due to excess calories without serious comorbidity with body mass index (BMI) of 40.0 to 44.9 in adult [facility]) 10/13/2011   Encounter for well child exam with abnormal findings 10/13/2011    Current Outpatient Medications on File Prior to Visit  Medication Sig Dispense Refill   Ferrous Sulfate (IRON) 325 (65 Fe) MG TABS Take one tablet daily with breakfast. (Patient not taking: Reported on 08/10/2022) 90 tablet 0   fluconazole (DIFLUCAN) 150 MG tablet Take 1 tablet (150 mg total) by mouth daily. Take 2nd dose in 3 days if still having symptoms. 2 tablet 0   metroNIDAZOLE (FLAGYL) 500 MG tablet Take 1 tablet (500 mg total) by mouth 2 (two) times daily. 14 tablet 0   valACYclovir (VALTREX) 500 MG tablet Take 1 tablet (500 mg total) by mouth daily. 90 tablet 3   No current facility-administered medications on file prior to visit.    No Known Allergies  Physical Exam:    Vitals:   01/14/23 1334  BP: (!) 99/56  Pulse: 77  Weight: 266 lb 6.4 oz (120.8 kg)   Height: 5' 8.25" (1.734 m)   Wt Readings from Last 3 Encounters:  01/14/23 266 lb 6.4 oz (120.8 kg)  08/10/22 278 lb 6.4 oz (126.3 kg) (>99%, Z= 2.73)*  07/14/22 271 lb (122.9 kg) (>99%, Z= 2.67)*   * Growth percentiles are based on CDC (Girls, 2-20 Years) data.    BP Readings from Last 3 Encounters:  01/14/23 (!) 99/56  08/10/22 (!) 88/59  07/14/22 118/70     Growth %ile SmartLinks can only be used for patients less than 33 years old. No LMP recorded.  Physical Exam Vitals and nursing note reviewed.  Constitutional:      General: She is not in acute distress.    Appearance: She is well-developed.  Eyes:     General: No scleral icterus.    Pupils: Pupils are equal, round, and reactive to light.  Neck:     Thyroid: No thyromegaly.  Cardiovascular:     Rate and Rhythm: Normal rate and regular rhythm.     Heart sounds: Normal heart sounds. No murmur heard. Pulmonary:     Effort: Pulmonary effort is normal.     Breath sounds: Normal breath sounds.  Abdominal:     Palpations: Abdomen is soft.     Tenderness: There is no abdominal tenderness. There is no guarding.  Musculoskeletal:        General: No tenderness. Normal range of motion.  Cervical back: Normal range of motion and neck supple.  Lymphadenopathy:     Cervical: No cervical adenopathy.  Skin:    General: Skin is warm and dry.     Findings: No rash.  Neurological:     Mental Status: She is alert and oriented to person, place, and time.     Cranial Nerves: No cranial nerve deficit.     Motor: No tremor.  Psychiatric:        Attention and Perception: Attention normal.        Mood and Affect: Mood normal.        Speech: Speech normal.        Behavior: Behavior normal.      Assessment/Plan: 1. Unprotected sex 2. Routine screening for STI (sexually transmitted infection) - WET PREP BY MOLECULAR PROBE - Urine cytology ancillary only -empirical treatment for yeast + BV; will follow-up via My Chart after  results return  -advised that screening today for gc/c may be too early to capture exposure from this week  -will repeat in 2 weeks including RPR and HIV  -continue with HSV suppressive therapy   - fluconazole (DIFLUCAN) 150 MG tablet; Take 1 tablet (150 mg total) by mouth daily.  Dispense: 2 tablet; Refill: 0 - metroNIDAZOLE (FLAGYL) 500 MG tablet; Take 1 tablet (500 mg total) by mouth 2 (two) times daily.  Dispense: 14 tablet; Refill: 0  3. Negative pregnancy test -negative; will rescreen again in 2 weeks  - POCT urine pregnancy  4. Vaginal discharge - fluconazole (DIFLUCAN) 150 MG tablet; Take 1 tablet (150 mg total) by mouth daily.  Dispense: 2 tablet; Refill: 0 - metroNIDAZOLE (FLAGYL) 500 MG tablet; Take 1 tablet (500 mg total) by mouth 2 (two) times daily.  Dispense: 14 tablet; Refill: 0

## 2023-01-15 LAB — WET PREP BY MOLECULAR PROBE
Candida species: DETECTED — AB
Gardnerella vaginalis: NOT DETECTED
MICRO NUMBER:: 15638520
SPECIMEN QUALITY:: ADEQUATE
Trichomonas vaginosis: NOT DETECTED

## 2023-01-18 ENCOUNTER — Encounter: Payer: Self-pay | Admitting: Family

## 2023-01-18 LAB — URINE CYTOLOGY ANCILLARY ONLY
Bacterial Vaginitis-Urine: NEGATIVE
Candida Urine: POSITIVE — AB
Chlamydia: NEGATIVE
Comment: NEGATIVE
Comment: NEGATIVE
Comment: NORMAL
Neisseria Gonorrhea: NEGATIVE
Trichomonas: NEGATIVE

## 2023-01-28 ENCOUNTER — Encounter: Payer: Self-pay | Admitting: Family

## 2023-01-28 ENCOUNTER — Ambulatory Visit (INDEPENDENT_AMBULATORY_CARE_PROVIDER_SITE_OTHER): Payer: BC Managed Care – PPO | Admitting: Family

## 2023-01-28 ENCOUNTER — Other Ambulatory Visit (HOSPITAL_COMMUNITY)
Admission: RE | Admit: 2023-01-28 | Discharge: 2023-01-28 | Disposition: A | Discharge status: Home / Self Care | Payer: BC Managed Care – PPO | Source: Ambulatory Visit | Attending: Family | Admitting: Family

## 2023-01-28 VITALS — BP 99/67 | HR 81 | Ht 67.72 in | Wt 269.4 lb

## 2023-01-28 DIAGNOSIS — B9689 Other specified bacterial agents as the cause of diseases classified elsewhere: Secondary | ICD-10-CM | POA: Diagnosis not present

## 2023-01-28 DIAGNOSIS — Z113 Encounter for screening for infections with a predominantly sexual mode of transmission: Secondary | ICD-10-CM

## 2023-01-28 DIAGNOSIS — Z8619 Personal history of other infectious and parasitic diseases: Secondary | ICD-10-CM | POA: Diagnosis not present

## 2023-01-28 DIAGNOSIS — N760 Acute vaginitis: Secondary | ICD-10-CM

## 2023-01-28 DIAGNOSIS — Z3202 Encounter for pregnancy test, result negative: Secondary | ICD-10-CM | POA: Diagnosis not present

## 2023-01-28 LAB — POCT URINE PREGNANCY: Preg Test, Ur: NEGATIVE

## 2023-01-28 NOTE — Progress Notes (Signed)
History was provided by the patient.  Mariah Thomas is a 22 y.o. female who is here for repeat STI screening.   PCP confirmed? Yes.    [name], NP  Plan from last visit:  1. Unprotected sex 2. Routine screening for STI (sexually transmitted infection) - WET PREP BY MOLECULAR PROBE - Urine cytology ancillary only -empirical treatment for yeast + BV; will follow-up via My Chart after results return  -advised that screening today for gc/c may be too early to capture exposure from this week  -will repeat in 2 weeks including RPR and HIV  -continue with HSV suppressive therapy    - fluconazole (DIFLUCAN) 150 MG tablet; Take 1 tablet (150 mg total) by mouth daily.  Dispense: 2 tablet; Refill: 0 - metroNIDAZOLE (FLAGYL) 500 MG tablet; Take 1 tablet (500 mg total) by mouth 2 (two) times daily.  Dispense: 14 tablet; Refill: 0   3. Negative pregnancy test -negative; will rescreen again in 2 weeks  - POCT urine pregnancy   4. Vaginal discharge - fluconazole (DIFLUCAN) 150 MG tablet; Take 1 tablet (150 mg total) by mouth daily.  Dispense: 2 tablet; Refill: 0 - metroNIDAZOLE (FLAGYL) 500 MG tablet; Take 1 tablet (500 mg total) by mouth 2 (two) times daily.  Dispense: 14 tablet; Refill: 0    HPI:   -vaginal discharge better, no symptoms  -LMP 10/29-11/2 -asking about HSV and safe sex practices; using suppressive therapy and wants to ensure she reduces the risks of viral shedding for her partner.  -considering birth control, LARC or return to OCPs.   Patient Active Problem List   Diagnosis Date Noted   Syncope 06/12/2022   Suicidal ideation 02/25/2022   Class 3 severe obesity due to excess calories without serious comorbidity with body mass index (BMI) of 40.0 to 44.9 in adult [facility]) 10/13/2011   Encounter for well child exam with abnormal findings 10/13/2011    Current Outpatient Medications on File Prior to Visit  Medication Sig Dispense Refill   valACYclovir (VALTREX) 500 MG  tablet Take 1 tablet (500 mg total) by mouth daily. 90 tablet 3   Ferrous Sulfate (IRON) 325 (65 Fe) MG TABS Take one tablet daily with breakfast. (Patient not taking: Reported on 08/10/2022) 90 tablet 0   fluconazole (DIFLUCAN) 150 MG tablet Take 1 tablet (150 mg total) by mouth daily. (Patient not taking: Reported on 01/28/2023) 2 tablet 0   metroNIDAZOLE (FLAGYL) 500 MG tablet Take 1 tablet (500 mg total) by mouth 2 (two) times daily. (Patient not taking: Reported on 01/28/2023) 14 tablet 0   No current facility-administered medications on file prior to visit.    No Known Allergies  Physical Exam:    Vitals:   01/28/23 1433  BP: 99/67  Pulse: 81  Weight: 269 lb 6.4 oz (122.2 kg)  Height: 5' 7.72" (1.72 m)    Growth %ile SmartLinks can only be used for patients less than 12 years old. No LMP recorded.  Physical Exam Vitals and nursing note reviewed.  Constitutional:      General: She is not in acute distress.    Appearance: She is well-developed.  Neck:     Thyroid: No thyromegaly.  Cardiovascular:     Rate and Rhythm: Normal rate and regular rhythm.     Heart sounds: No murmur heard. Pulmonary:     Breath sounds: Normal breath sounds.  Abdominal:     Palpations: Abdomen is soft.  Musculoskeletal:     Right lower leg:  No edema.     Left lower leg: No edema.  Lymphadenopathy:     Cervical: No cervical adenopathy.  Skin:    General: Skin is warm.     Capillary Refill: Capillary refill takes less than 2 seconds.     Findings: No rash.  Neurological:     General: No focal deficit present.     Mental Status: She is alert and oriented to person, place, and time.     Motor: No tremor.     Comments: No tremor  Psychiatric:        Mood and Affect: Mood normal.      Assessment/Plan:  1. BV (bacterial vaginosis) 2. Routine screening for STI (sexually transmitted infection) -BV symptoms resolved with Flagyl dosing; will repeat gc/c today and also assess RPR and HIV;  return precautions reviewed  - Urine cytology ancillary only - HIV Antibody (routine testing w rflx) - RPR  3. 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 Negative pregnancy test - POCT urine pregnancy   -discussed birth control options; she is contemplative; return as needed for method or reach out via My Chart for Rx if needed

## 2023-01-29 LAB — RPR: RPR Ser Ql: NONREACTIVE

## 2023-01-29 LAB — HIV ANTIBODY (ROUTINE TESTING W REFLEX): HIV 1&2 Ab, 4th Generation: NONREACTIVE

## 2023-01-31 ENCOUNTER — Encounter: Payer: Self-pay | Admitting: Family

## 2023-02-01 LAB — URINE CYTOLOGY ANCILLARY ONLY
Bacterial Vaginitis-Urine: NEGATIVE
Candida Urine: NEGATIVE
Chlamydia: NEGATIVE
Comment: NORMAL
Comment: NEGATIVE
Comment: NEGATIVE
Neisseria Gonorrhea: NEGATIVE
Trichomonas: NEGATIVE

## 2023-02-06 ENCOUNTER — Encounter (HOSPITAL_COMMUNITY): Payer: Self-pay

## 2023-02-06 ENCOUNTER — Emergency Department (HOSPITAL_COMMUNITY): Payer: BC Managed Care – PPO

## 2023-02-06 ENCOUNTER — Emergency Department (HOSPITAL_COMMUNITY)
Admission: EM | Admit: 2023-02-06 | Discharge: 2023-02-06 | Disposition: A | Discharge status: Home / Self Care | Payer: BC Managed Care – PPO | Attending: Emergency Medicine | Admitting: Emergency Medicine

## 2023-02-06 DIAGNOSIS — Z23 Encounter for immunization: Secondary | ICD-10-CM | POA: Insufficient documentation

## 2023-02-06 DIAGNOSIS — S6991XA Unspecified injury of right wrist, hand and finger(s), initial encounter: Secondary | ICD-10-CM | POA: Diagnosis not present

## 2023-02-06 DIAGNOSIS — S61011A Laceration without foreign body of right thumb without damage to nail, initial encounter: Secondary | ICD-10-CM | POA: Diagnosis not present

## 2023-02-06 DIAGNOSIS — W231XXA Caught, crushed, jammed, or pinched between stationary objects, initial encounter: Secondary | ICD-10-CM | POA: Insufficient documentation

## 2023-02-06 MED ORDER — TETANUS-DIPHTH-ACELL PERTUSSIS 5-2.5-18.5 LF-MCG/0.5 IM SUSY
0.5000 mL | PREFILLED_SYRINGE | Freq: Once | INTRAMUSCULAR | Status: AC
  Administered 2023-02-06: 0.5 mL via INTRAMUSCULAR
  Filled 2023-02-06: qty 0.5

## 2023-02-06 NOTE — ED Triage Notes (Signed)
Pt c/o R thumb pain w/ .75in laceration.  Pain score 3/10.  Pt reports slamming it in a car door this afternoon.

## 2023-02-06 NOTE — ED Provider Notes (Signed)
[place] EMERGENCY DEPARTMENT AT [facility] Provider Note   CSN: [id number] Arrival date & time: 02/06/23  1434     History Chief Complaint  Patient presents with   Thumb injury    Mariah Thomas is a 22 y.o. female reportedly otherwise healthy presents to the emergency dept today for evaluation of right thumb injury.  Patient reports that shortly prior to arrival, she got her thumb caught between her car door.  Reports intact sensation.  She does not know when her last tetanus was updated.  Denies any other injury.  No known drug allergies.  HPI     Home Medications Prior to Admission medications   Medication Sig Start Date End Date Taking? Authorizing Provider  Ferrous Sulfate (IRON) 325 (65 Fe) MG TABS Take one tablet daily with breakfast. Patient not taking: Reported on 08/10/2022 07/16/22   Georges Mouse, NP  fluconazole (DIFLUCAN) 150 MG tablet Take 1 tablet (150 mg total) by mouth daily. Patient not taking: Reported on 01/28/2023 01/14/23   Georges Mouse, NP  metroNIDAZOLE (FLAGYL) 500 MG tablet Take 1 tablet (500 mg total) by mouth 2 (two) times daily. Patient not taking: Reported on 01/28/2023 01/14/23   Georges Mouse, NP  valACYclovir (VALTREX) 500 MG tablet Take 1 tablet (500 mg total) by mouth daily. 08/16/22   Georges Mouse, NP      Allergies    Patient has no known allergies.    Review of Systems   Review of Systems  Constitutional:  Negative for fever.  Musculoskeletal:  Positive for myalgias.  Skin:  Positive for wound.    Physical Exam Updated Vital Signs BP 115/82 (BP Location: Left Arm)   Pulse 62   Temp 98.6 F (37 C) (Oral)   Resp 20   Ht 5' 7.72" (1.72 m)   Wt 122.2 kg   SpO2 98%   BMI 41.30 kg/m  Physical Exam Vitals and nursing note reviewed.  Constitutional:      General: She is not in acute distress.    Appearance: She is not toxic-appearing.  Eyes:     General: No scleral icterus. Cardiovascular:     Rate and  Rhythm: Normal rate.  Pulmonary:     Effort: Pulmonary effort is normal. No respiratory distress.  Musculoskeletal:        General: Signs of injury present.     Comments: RUE - brisk cap refill present. Nail intact and appears uninjured. Small laceration seen to the pad of the right thumb. Appears to be forming blood blister distal to this as well. Compartments are soft. She is NV intact. Palpable radial pulses. Please see image.   Skin:    General: Skin is warm and dry.     Capillary Refill: Capillary refill takes less than 2 seconds.  Neurological:     Mental Status: She is alert.     ED Results / Procedures / Treatments   Labs (all labs ordered are listed, but only abnormal results are displayed) Labs Reviewed - No data to display  EKG None  Radiology DG Finger Thumb Right  Result Date: 02/06/2023 CLINICAL DATA:  Right thumb laceration EXAM: RIGHT THUMB 2+V COMPARISON:  None Available. FINDINGS: There is no evidence of fracture or dislocation. There is no evidence of arthropathy or other focal bone abnormality. No focal soft tissue swelling. No radiopaque foreign body. IMPRESSION: Negative. Electronically Signed   By: Duanne Guess D.O.   On: 02/06/2023 16:06  Procedures .Marland KitchenLaceration Repair  Date/Time: 02/06/2023 4:00 PM  Performed by: Achille Rich, PA-C Authorized by: Achille Rich, PA-C   Consent:    Consent obtained:  Verbal   Consent given by:  Patient   Risks, benefits, and alternatives were discussed: yes     Risks discussed:  Infection, pain, nerve damage, need for additional repair, retained foreign body, poor cosmetic result and tendon damage   Alternatives discussed:  No treatment and delayed treatment Universal protocol:    Procedure explained and questions answered to patient or proxy's satisfaction: yes     Imaging studies available: yes     Patient identity confirmed:  Verbally with patient Anesthesia:    Anesthesia method:  None Laceration  details:    Location:  Finger   Finger location:  R thumb   Length (cm):  1 Pre-procedure details:    Preparation:  Imaging obtained to evaluate for foreign bodies Exploration:    Imaging obtained: x-ray     Imaging outcome: foreign body not noted     Wound extent: no underlying fracture     Contaminated: no   Treatment:    Area cleansed with:  Povidone-iodine and saline   Amount of cleaning:  Extensive   Irrigation solution:  Sterile saline   Irrigation method:  Syringe Skin repair:    Repair method:  Steri-Strips   Number of Steri-Strips:  1 Approximation:    Approximation:  Close Repair type:    Repair type:  Simple Post-procedure details:    Dressing:  Adhesive bandage   Procedure completion:  Tolerated well, no immediate complications Comments:     Discussed wound with patient. Offered suturing for better healing, however discussed with her that this likely could heal on it's own. Patient does not want suturing and is requesting steri strips. I discussed with her that steri strips are less likely to stay in this area, but they can be trialed. The area with cleansed with betadine and NS and irrigated with NS. One steri strip was placed over the area and was covered with a bandage.      Medications Ordered in ED Medications  Tdap (BOOSTRIX) injection 0.5 mL (0.5 mLs Intramuscular Given 02/06/23 1616)    ED Course/ Medical Decision Making/ A&P    Medical Decision Making Amount and/or Complexity of Data Reviewed Radiology: ordered.  Risk Prescription drug management.   22 y.o. female presents to the ER for evaluation of right thumb injury. Differential diagnosis includes but is not limited to laceration, contusion, fracture, open fracture. Vital signs stable. Physical exam as noted above.   XR imaging shows Negative. Per radiologist's interpretation.   The patient declined pain medication. Unknown when her last tetanus was, will update that while she is here.    Please see procedure note. Patient is NV intact distally. Stable vital signs. She is stable for discharge home.   We discussed the results of the labs/imaging. The plan is wound care, follow up as needed. We discussed strict return precautions and red flag symptoms. The patient verbalized their understanding and agrees to the plan. The patient is stable and being discharged home in good condition.  Portions of this report may have been transcribed using voice recognition software. Every effort was made to ensure accuracy; however, inadvertent computerized transcription errors may be present.   Final Clinical Impression(s) / ED Diagnoses Final diagnoses:  Injury of right thumb, initial encounter    Rx / DC Orders ED Discharge Orders     None  Achille Rich, PA-C 02/06/23 1705    Cathren Laine, MD 02/06/23 2154

## 2023-02-06 NOTE — Discharge Instructions (Signed)
You were seen in the ER for evaluation of your thumb injury. Your Xray was unremarkable. You have chosen a steri strip for closing. This may come off in the next few days.  Please make sure that you are remembering to keep your wound clean daily with Dial soap and water and daily bandage changes. I recommend keeping the wound covered for the next 48-72 hours and then to your comfort afterwards. Do not expose the wound to any dishwater, pools, lakes, oceans, [employer], dirt or grime. Keeping the wound clean and away from contamination can help ensure good wound healing and help to prevent infections. For pain, I recommend Tylenol 1000mg  and/or ibuprofen 600mg  every 6 hours as needed for pain. Do not take ibuprofen if you are concerned for pregnancy. Let your PCP know that we updated your tetanus shot for you today. If you have any concerns, new or worsening symptoms, please return to the nearest ER for re-evaluation.   Contact a doctor if: A wound with stitches opens up. You have more redness, swelling, or pain in your hand. You have more fluid or blood coming from your hand. Your hand feels warm to the touch. You have pus or a bad smell coming from your hand. You have a fever. Get help right away if: You suddenly have very bad pain in your hand. You had feeling in your hand before but you suddenly lose feeling. Your wrist or hand becomes bent (contracted)without you trying to bend it. Your symptoms had gotten better and they suddenly get worse. Your hand or fingers are turning pink or blue.

## 2023-03-01 IMAGING — CT CT HEAD W/O CM
4 series · 16 of 47 positions shown, 18 images · non-contrast
Comparison: None.

CLINICAL DATA: Head trauma, loss of consciousness (Ped 0-18y)
headache, nausea, slurring speech. Photosensitivity.

EXAM:
CT HEAD WITHOUT CONTRAST
TECHNIQUE: Contiguous axial images were obtained from the base of the skull
through the vertex without intravenous contrast.

[Series 3: head without · axial · non-contrast · 0.41mm/px · z∈[-28,+92]mm · 7 of 32 slices shown, 9 images]
[im 4/32  brain]
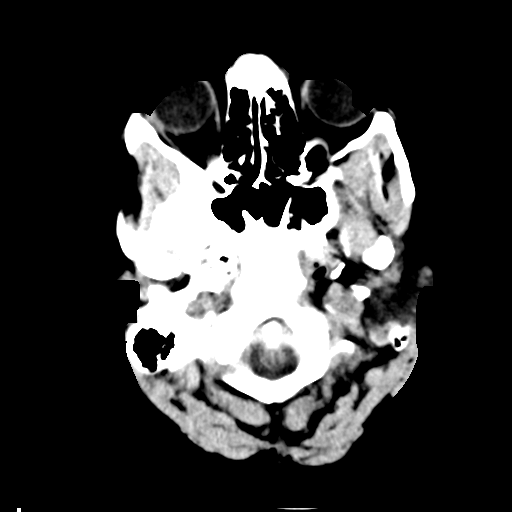
[im 4/32  bone]
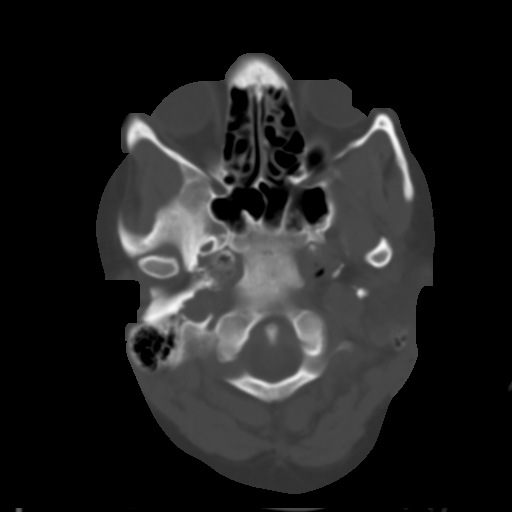
[im 8/32  brain]
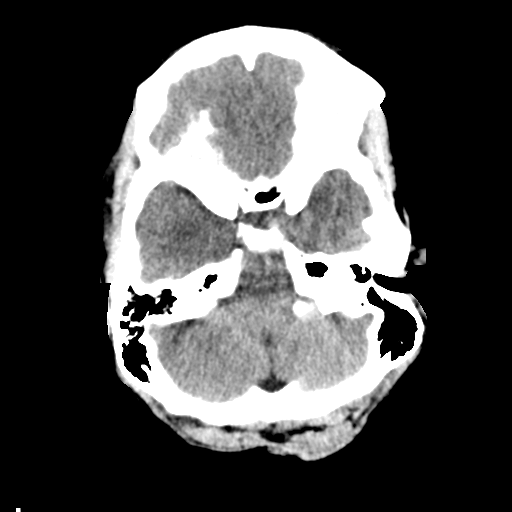
[im 12/32  brain]
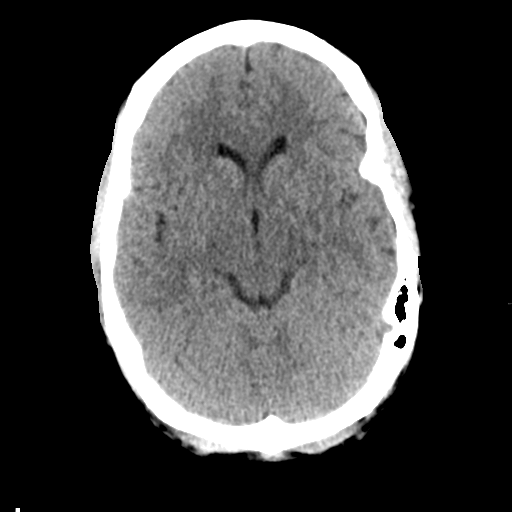
[im 16/32  brain]
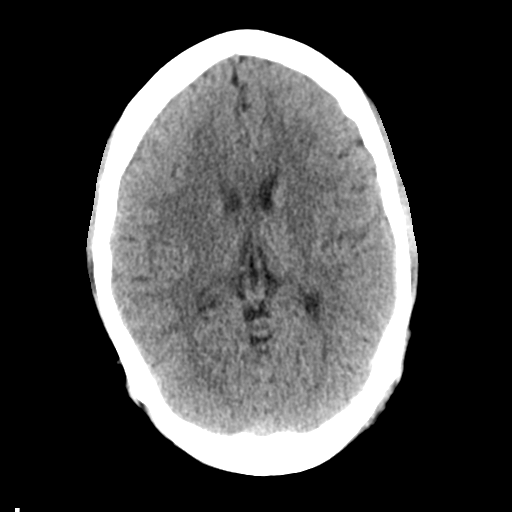
[im 20/32  brain]
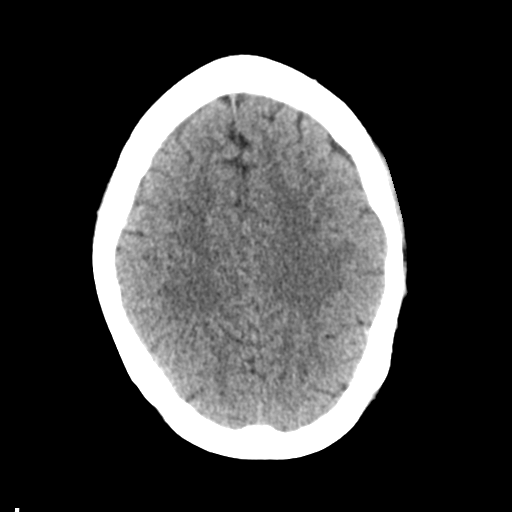
[im 20/32  bone]
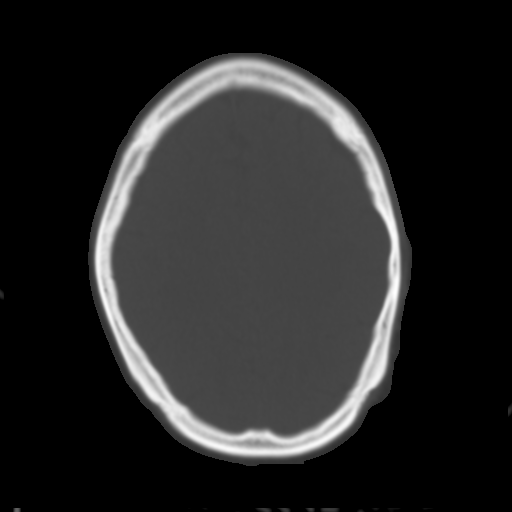
[im 24/32  brain]
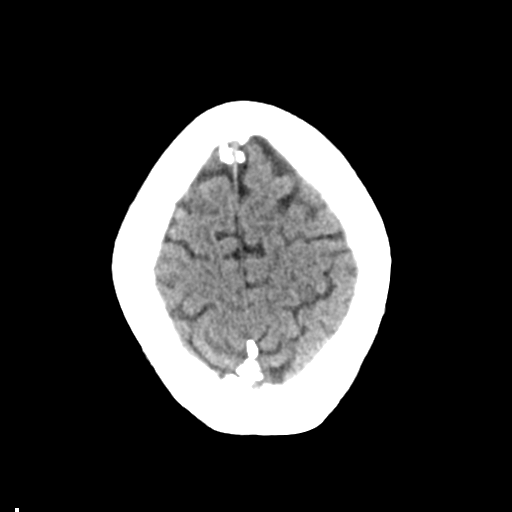
[im 28/32  brain]
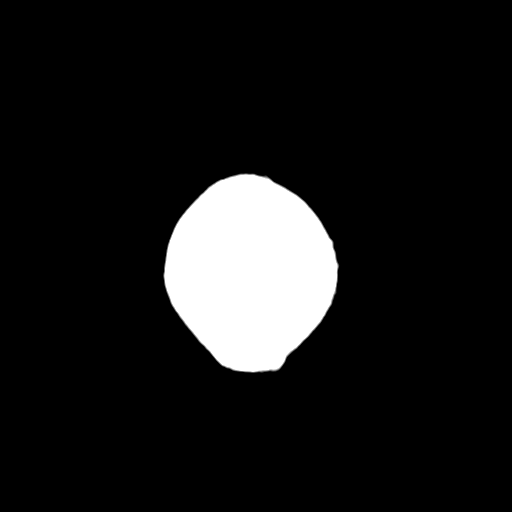

[Series 4: head bone · axial · 0.41mm/px · z∈[-30,+2]mm · 3 of 80 slices shown]
[im 8/80  bone]
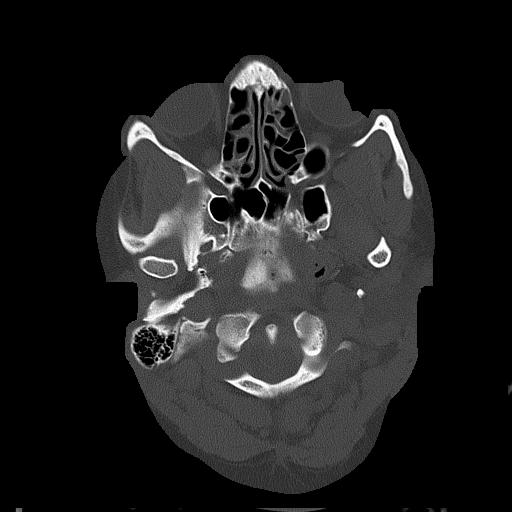
[im 16/80  bone]
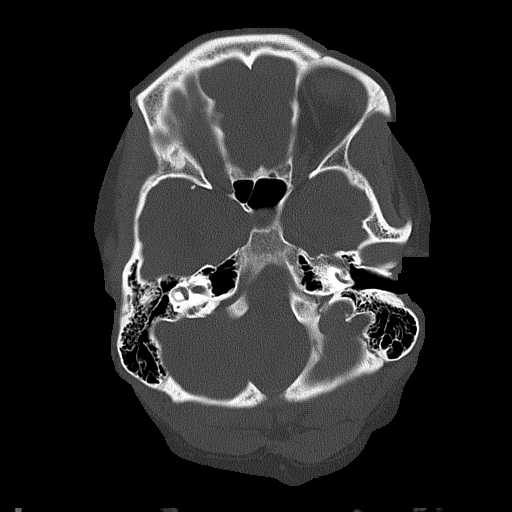
[im 24/80  bone]
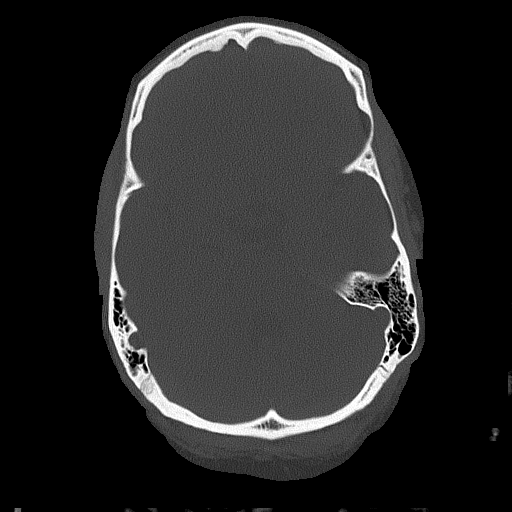

[Series 5: head without cor · coronal · non-contrast · 0.31mm/px · 3 of 69 slices shown]
[im 23/69  brain]
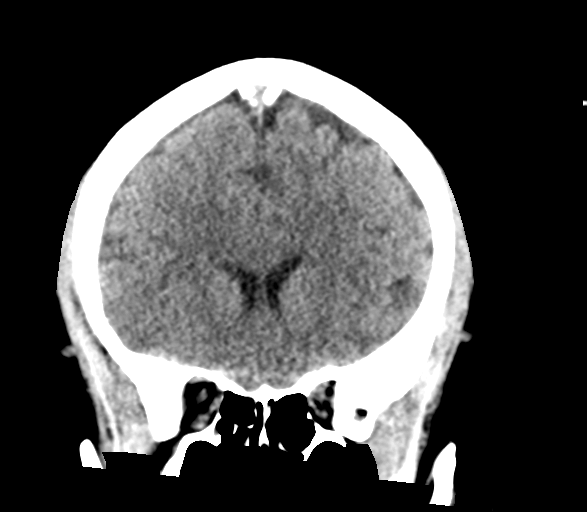
[im 31/69  brain]
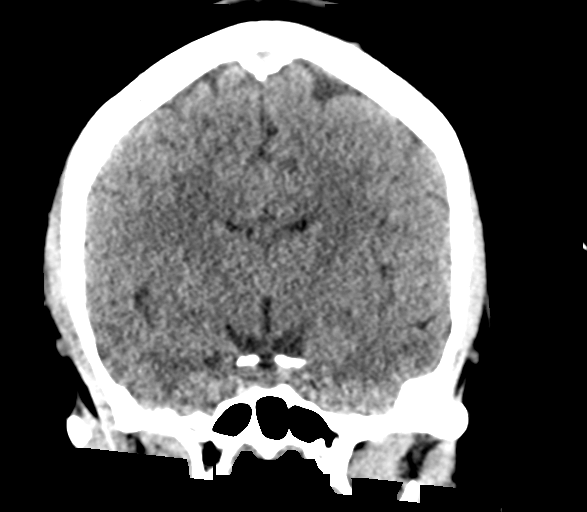
[im 38/69  brain]
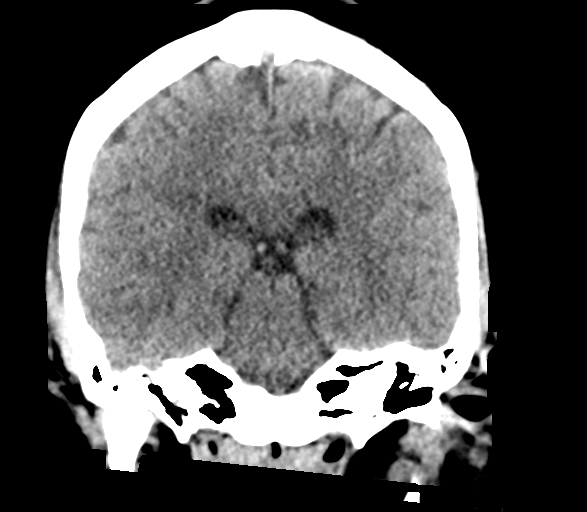

[Series 6: head without sag · sagittal · non-contrast · 0.31mm/px · 3 of 67 slices shown]
[im 26/67  brain]
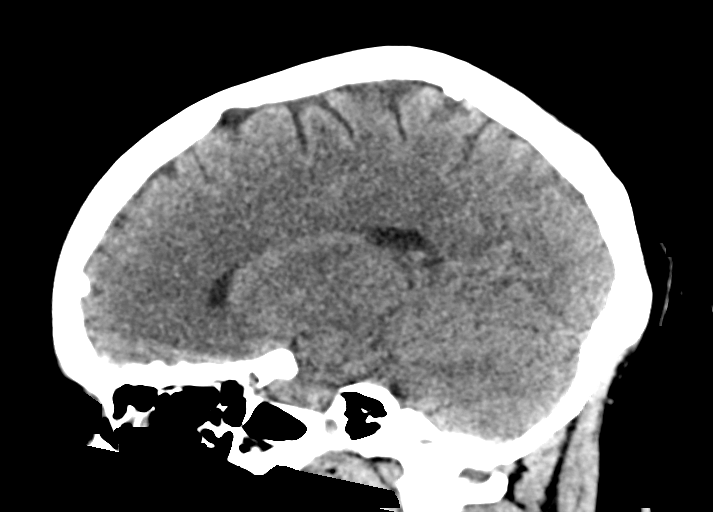
[im 34/67  brain]
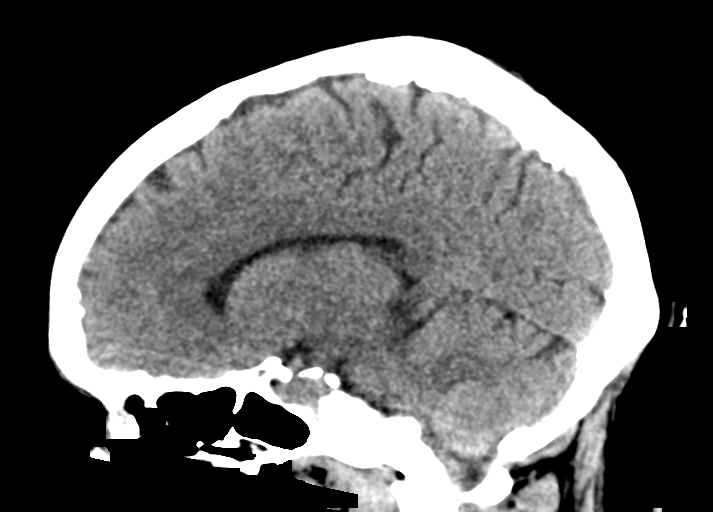
[im 41/67  brain]
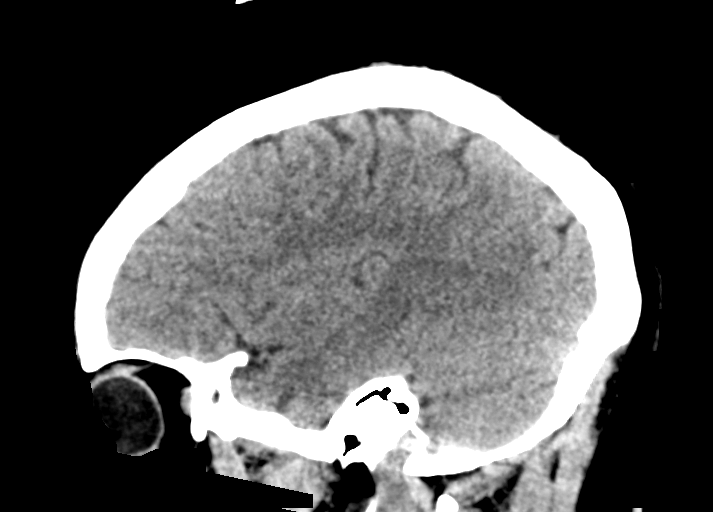

[16 of 47 positions shown; findings below may reference images not displayed]

FINDINGS: Brain: No acute intracranial abnormality. Specifically, no
hemorrhage, hydrocephalus, mass lesion, acute infarction, or
significant intracranial injury.

Vascular: No hyperdense vessel or unexpected calcification.

Skull: No acute calvarial abnormality.

Sinuses/Orbits: No acute findings

Other: None
IMPRESSION: Normal study.

## 2023-08-03 ENCOUNTER — Other Ambulatory Visit: Payer: Self-pay | Admitting: Family

## 2023-08-03 DIAGNOSIS — Z7251 High risk heterosexual behavior: Secondary | ICD-10-CM

## 2023-08-03 DIAGNOSIS — N898 Other specified noninflammatory disorders of vagina: Secondary | ICD-10-CM

## 2023-08-04 ENCOUNTER — Other Ambulatory Visit: Payer: Self-pay | Admitting: Family

## 2023-08-04 MED ORDER — FLUCONAZOLE 150 MG PO TABS: 150.0000 mg | ORAL_TABLET | Freq: Every day | ORAL | 0 refills | Status: AC

## 2023-09-07 ENCOUNTER — Encounter: Payer: Self-pay | Admitting: Family

## 2023-09-07 ENCOUNTER — Other Ambulatory Visit: Payer: Self-pay | Admitting: Family

## 2023-09-08 ENCOUNTER — Other Ambulatory Visit: Payer: Self-pay | Admitting: Family

## 2023-09-08 MED ORDER — VALACYCLOVIR HCL 500 MG PO TABS: 500.0000 mg | ORAL_TABLET | Freq: Every day | ORAL | 3 refills | Status: AC
